# Patient Record
Sex: Male | Born: 1985 | Race: White | Hispanic: No | Marital: Single
Health system: Southern US, Community
[De-identification: ages and names within clinical notes are randomized; demographics above are authoritative.]

---
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
## 2006-11-22 ENCOUNTER — Ambulatory Visit (HOSPITAL_COMMUNITY): Admission: RE | Admit: 2006-11-22 | Discharge: 2006-11-22 | Payer: Self-pay | Admitting: *Deleted

## 2008-09-05 IMAGING — US US SCROTUM
1 series · 14 of 25 positions shown · non-contrast
Comparison: none

CLINICAL DATA: Left testicle mass and pain in that area last week.   
 SCROTAL ULTRASOUND:
TECHNIQUE: Complete ultrasound examination of the testicles, epididymis, and other scrotal structures was performed.

[Series 1: unknown · 0.11mm/px · 14 of 62 slices shown]
[im 1/62]
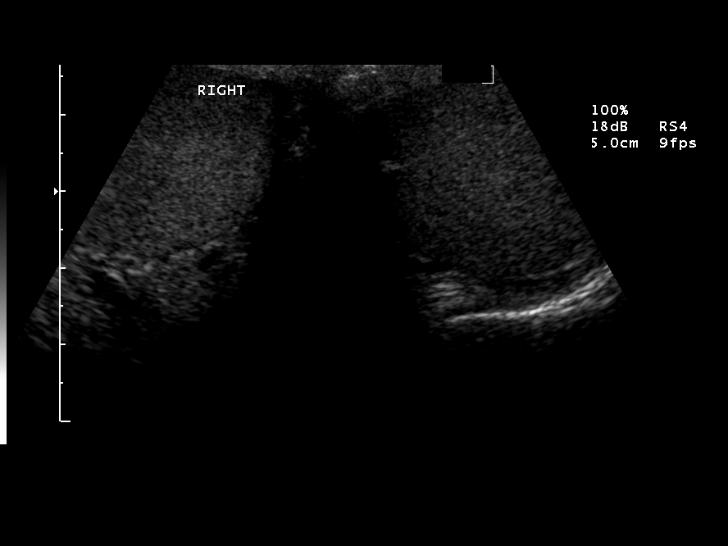
[im 6/62]
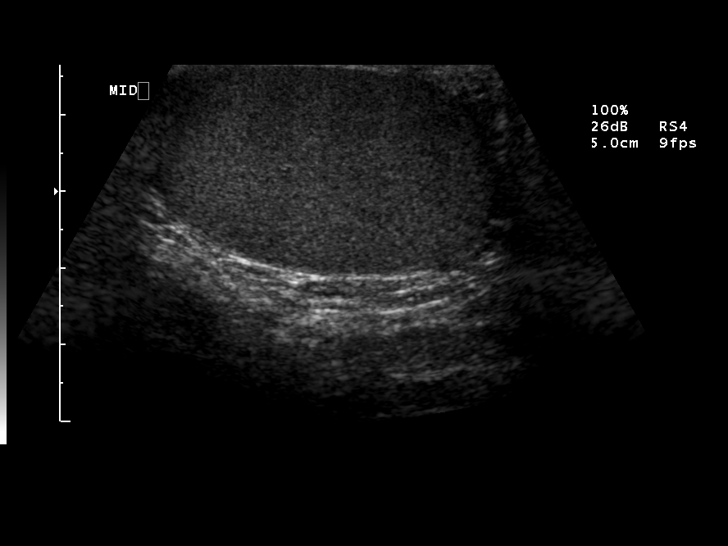
[im 11/62]
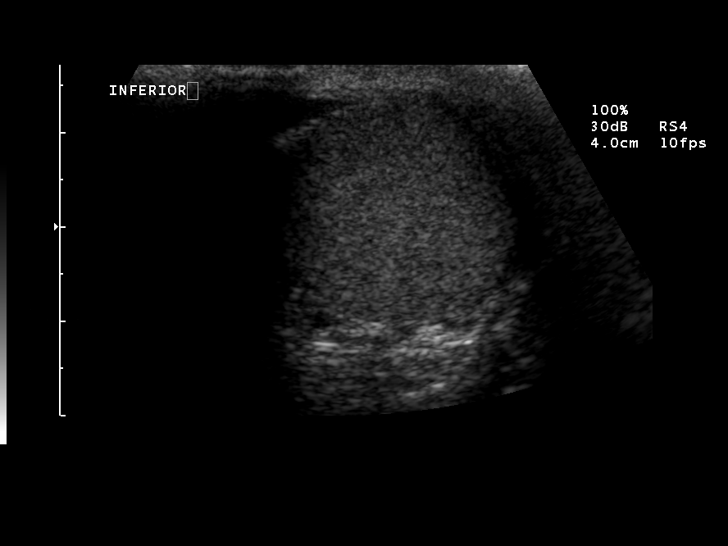
[im 16/62]
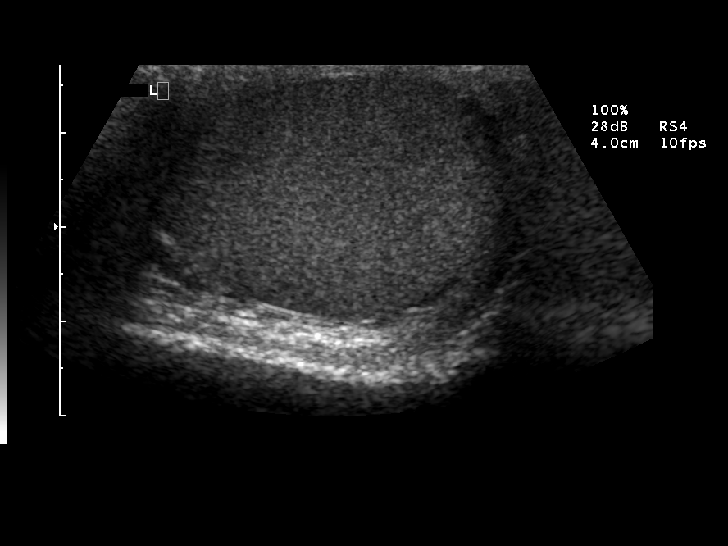
[im 21/62]
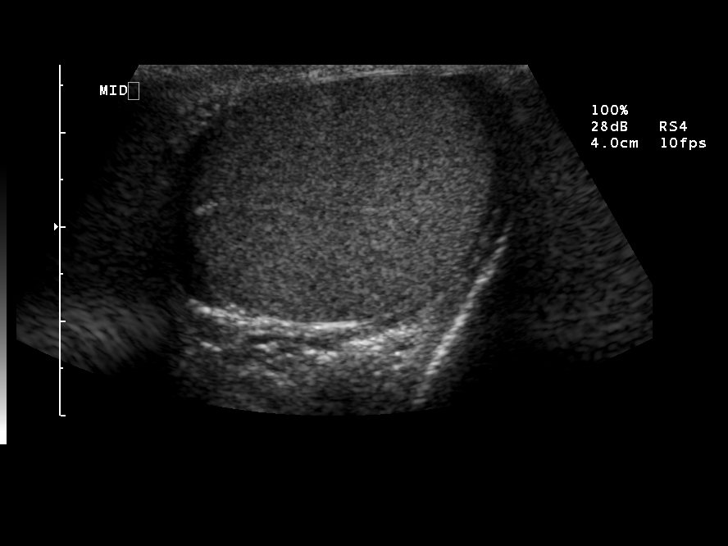
[im 23/62]
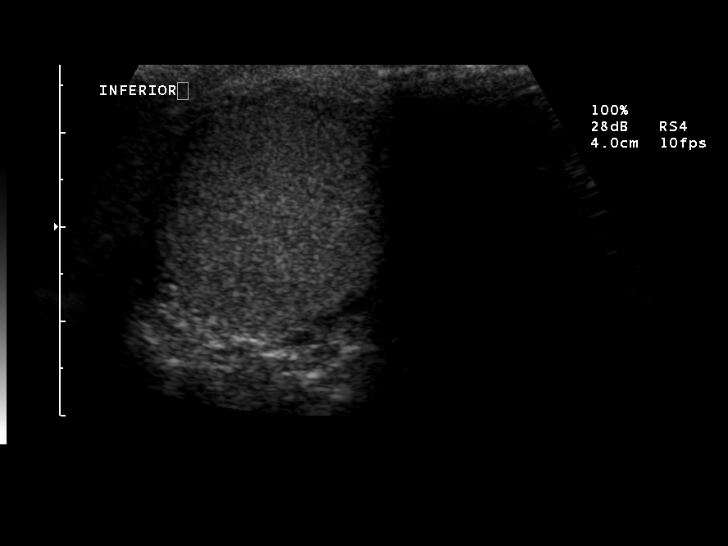
[im 28/62]
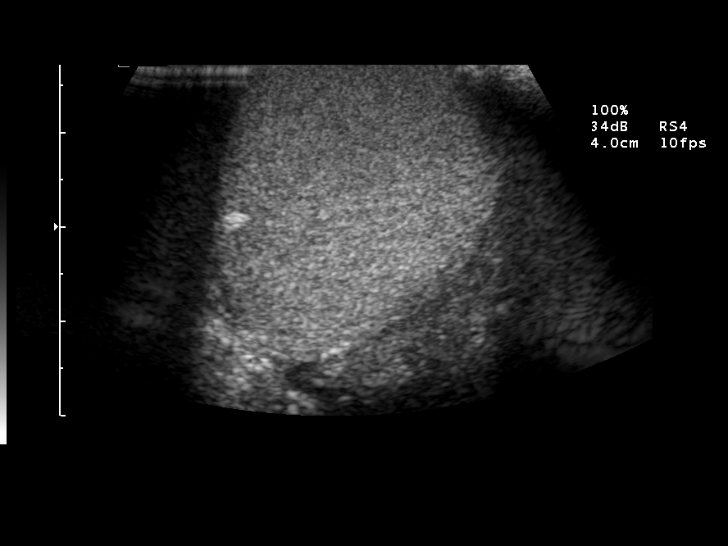
[im 34/62]
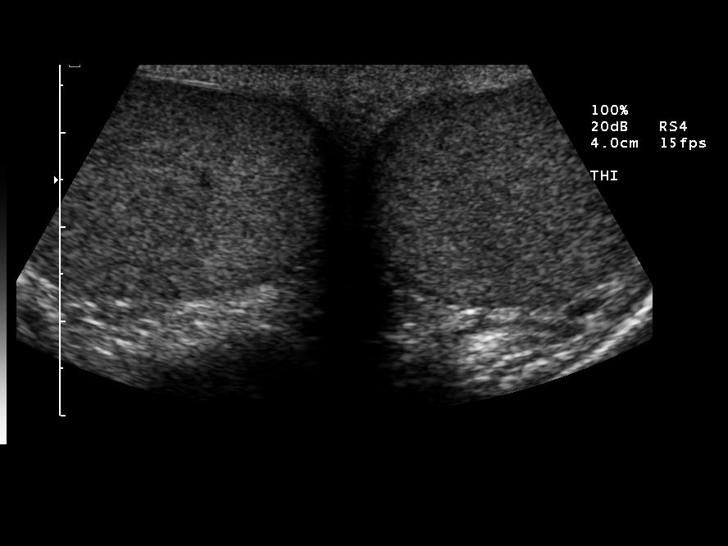
[im 39/62]
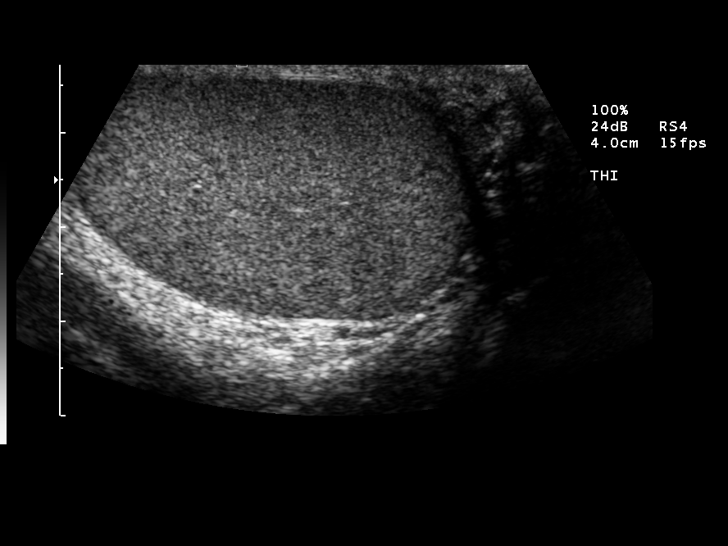
[im 41/62]
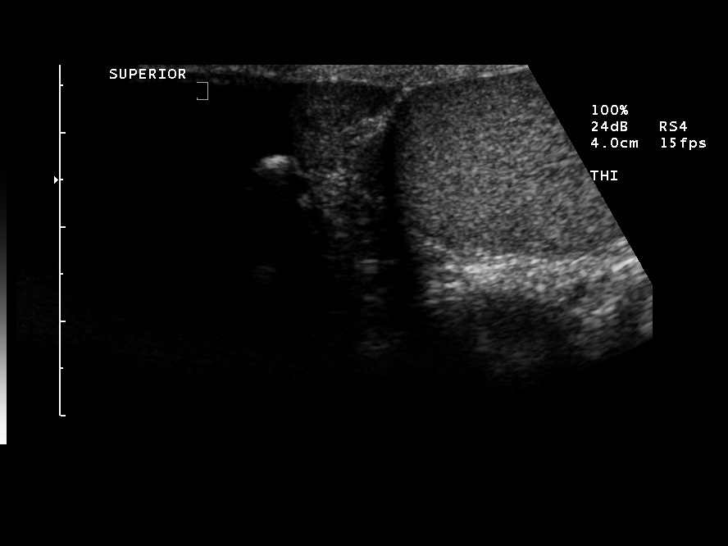
[im 46/62]
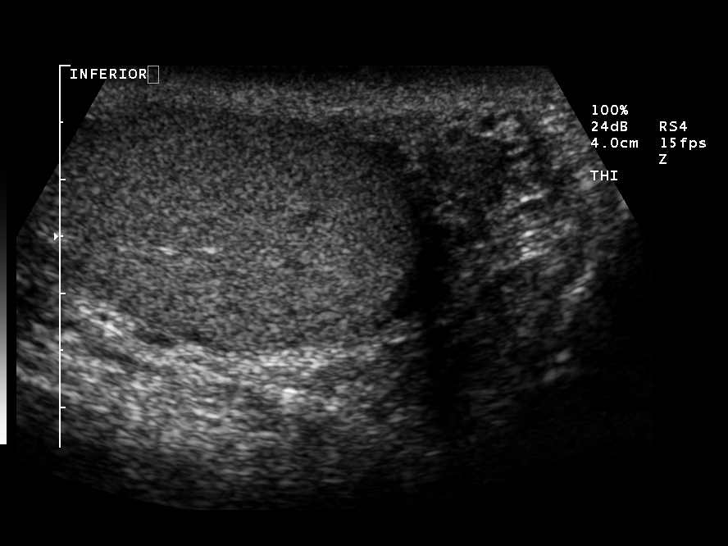
[im 51/62]
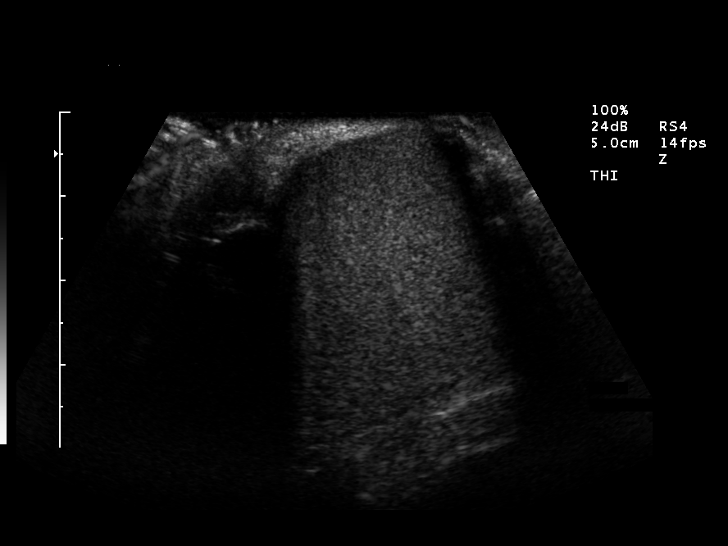
[im 56/62]
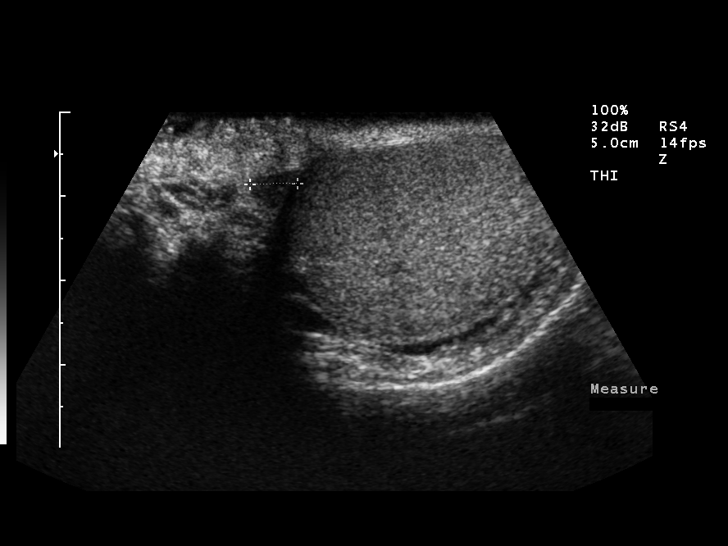
[im 62/62]
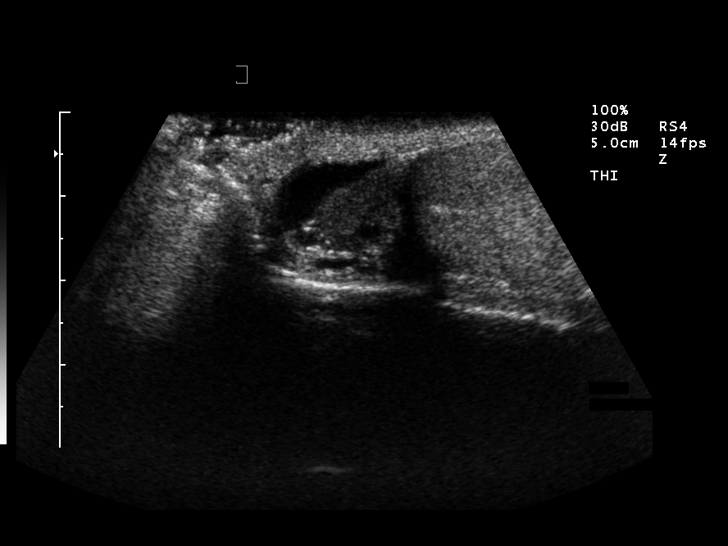

[14 of 25 positions shown; findings below may reference images not displayed]

FINDINGS: The right testicle is 4.2 x 2.7 x 2.8 cm in size and is homogeneous and has perfusion.   The left testicle is 4.0 x 2.6 x 3.2 cm and also is homogeneous and has perfusion.  The epididymides are normal bilaterally.  The patient?s area of palpable concern was scanned and is demonstrated to be the left epididymal head.  It appears normal and the patient reports that the area was tender last week but is now improved.  This does not appear to be a testicular or true epididymal mass.
IMPRESSION: Normal scrotal ultrasound.  The area of concern appears to be a normal epididymal head.

## 2019-10-06 ENCOUNTER — Ambulatory Visit: Payer: Self-pay | Attending: Internal Medicine

## 2019-10-06 DIAGNOSIS — Z23 Encounter for immunization: Secondary | ICD-10-CM

## 2019-10-06 NOTE — Progress Notes (Signed)
   Covid-[facility] Vaccination Clinic  Name:  Levi Hines    MRN: [id number] DOB: 1985/11/18  10/06/2019  Mr. [name] was observed post Covid-19 immunization for 15 minutes without incident. He was provided with Vaccine Information Sheet and instruction to access the V-Safe system.   Mr. [name] was instructed to call 911 with any severe reactions post vaccine: Marland Kitchen Difficulty breathing  . Swelling of face and throat  . A fast heartbeat  . A bad rash all over body  . Dizziness and weakness   Immunizations Administered    Name Date Dose VIS Date Route   Moderna COVID-19 Vaccine 10/06/2019  8:13 AM 0.5 mL 06/13/2019 Intramuscular   Manufacturer: Moderna   Lot: [id number]   NDC: 96116-435-39

## 2019-11-07 ENCOUNTER — Ambulatory Visit: Payer: Self-pay | Attending: Internal Medicine

## 2019-11-07 DIAGNOSIS — Z23 Encounter for immunization: Secondary | ICD-10-CM

## 2019-11-07 NOTE — Progress Notes (Signed)
   Covid-19 Vaccination Clinic  Name:  Levi Hines    MRN: [id number] DOB: Sep 30, 1985  11/07/2019  [name] was observed post Covid-19 immunization for 15 minutes without incident. He was provided with Vaccine Information Sheet and instruction to access the V-Safe system.   [name] was instructed to call 911 with any severe reactions post vaccine: Marland Kitchen Difficulty breathing  . Swelling of face and throat  . A fast heartbeat  . A bad rash all over body  . Dizziness and weakness   Immunizations Administered    Name Date Dose VIS Date Route   Moderna COVID-19 Vaccine 11/07/2019  9:22 AM 0.5 mL 06/2019 Intramuscular   Manufacturer: Moderna   Lot: [id number]   NDC: 65681-275-17
